# Patient Record
Sex: Male | Born: 1975
Health system: Southern US, Community
[De-identification: ages and names within clinical notes are randomized; demographics above are authoritative.]

---

## 2004-03-08 ENCOUNTER — Emergency Department (HOSPITAL_COMMUNITY): Admission: EM | Admit: 2004-03-08 | Discharge: 2004-03-08 | Payer: Self-pay | Admitting: Emergency Medicine

## 2004-03-14 ENCOUNTER — Ambulatory Visit (HOSPITAL_BASED_OUTPATIENT_CLINIC_OR_DEPARTMENT_OTHER): Admission: RE | Admit: 2004-03-14 | Discharge: 2004-03-14 | Payer: Self-pay | Admitting: Orthopedic Surgery

## 2010-09-11 ENCOUNTER — Emergency Department (HOSPITAL_COMMUNITY)
Admission: EM | Admit: 2010-09-11 | Discharge: 2010-09-11 | Disposition: A | Discharge status: Home / Self Care | Payer: 59 | Attending: Emergency Medicine | Admitting: Emergency Medicine

## 2010-09-11 DIAGNOSIS — R319 Hematuria, unspecified: Secondary | ICD-10-CM | POA: Insufficient documentation

## 2010-09-11 DIAGNOSIS — R300 Dysuria: Secondary | ICD-10-CM | POA: Insufficient documentation

## 2010-09-11 LAB — URINE MICROSCOPIC-ADD ON

## 2010-09-11 LAB — URINALYSIS, ROUTINE W REFLEX MICROSCOPIC
Bilirubin Urine: NEGATIVE
Glucose, UA: NEGATIVE mg/dL
Ketones, ur: NEGATIVE mg/dL
Leukocytes, UA: NEGATIVE
Nitrite: NEGATIVE
Protein, ur: NEGATIVE mg/dL
Specific Gravity, Urine: 1.025 (ref 1.005–1.030)
Urobilinogen, UA: 1 mg/dL (ref 0.0–1.0)
pH: 5.5 (ref 5.0–8.0)

## 2010-09-12 LAB — GC/CHLAMYDIA PROBE AMP, URINE
Chlamydia, Swab/Urine, PCR: NEGATIVE
GC Probe Amp, Urine: NEGATIVE

## 2013-07-05 ENCOUNTER — Other Ambulatory Visit: Payer: Self-pay | Admitting: Hematology and Oncology

## 2015-09-20 ENCOUNTER — Encounter (HOSPITAL_COMMUNITY): Payer: Self-pay | Admitting: *Deleted

## 2015-09-20 ENCOUNTER — Emergency Department (HOSPITAL_COMMUNITY)
Admission: EM | Admit: 2015-09-20 | Discharge: 2015-09-21 | Disposition: A | Discharge status: Home / Self Care | Payer: 59 | Attending: Emergency Medicine | Admitting: Emergency Medicine

## 2015-09-20 ENCOUNTER — Emergency Department (HOSPITAL_COMMUNITY): Payer: 59

## 2015-09-20 DIAGNOSIS — R7989 Other specified abnormal findings of blood chemistry: Secondary | ICD-10-CM | POA: Diagnosis not present

## 2015-09-20 DIAGNOSIS — I4891 Unspecified atrial fibrillation: Secondary | ICD-10-CM

## 2015-09-20 DIAGNOSIS — R000 Tachycardia, unspecified: Secondary | ICD-10-CM | POA: Diagnosis present

## 2015-09-20 LAB — BASIC METABOLIC PANEL
Anion gap: 14 (ref 5–15)
BUN: 13 mg/dL (ref 6–20)
CO2: 25 mmol/L (ref 22–32)
Calcium: 9.5 mg/dL (ref 8.9–10.3)
Chloride: 101 mmol/L (ref 101–111)
Creatinine, Ser: 1.52 mg/dL — ABNORMAL HIGH (ref 0.61–1.24)
GFR calc Af Amer: >60 mL/min (ref >60)
GFR calc non Af Amer: 57 mL/min — ABNORMAL LOW (ref >60)
Glucose, Bld: 137 mg/dL — ABNORMAL HIGH (ref 65–99)
Potassium: 4 mmol/L (ref 3.5–5.1)
Sodium: 140 mmol/L (ref 135–145)

## 2015-09-20 LAB — CBC
HCT: 47.9 % (ref 39.0–52.0)
Hemoglobin: 16.1 g/dL (ref 13.0–17.0)
MCH: 28.9 pg (ref 26.0–34.0)
MCHC: 33.6 g/dL (ref 30.0–36.0)
MCV: 86 fL (ref 78.0–100.0)
Platelets: 277 10*3/uL (ref 150–400)
RBC: 5.57 MIL/uL (ref 4.22–5.81)
RDW: 14.9 % (ref 11.5–15.5)
WBC: 12.6 10*3/uL — ABNORMAL HIGH (ref 4.0–10.5)

## 2015-09-20 LAB — MAGNESIUM: Magnesium: 2.1 mg/dL (ref 1.7–2.4)

## 2015-09-20 LAB — TROPONIN I: Troponin I: <0.03 ng/mL (ref <0.031)

## 2015-09-20 MED ORDER — ETOMIDATE 2 MG/ML IV SOLN
10.0000 mg | Freq: Once | INTRAVENOUS | Status: AC
  Administered 2015-09-21: 10 mg via INTRAVENOUS
  Filled 2015-09-20: qty 10

## 2015-09-20 MED ORDER — ENOXAPARIN SODIUM 150 MG/ML ~~LOC~~ SOLN
150.0000 mg | Freq: Once | SUBCUTANEOUS | Status: AC
  Administered 2015-09-21: 150 mg via SUBCUTANEOUS
  Filled 2015-09-20: qty 1

## 2015-09-20 MED ORDER — DILTIAZEM HCL 25 MG/5ML IV SOLN
15.0000 mg | Freq: Once | INTRAVENOUS | Status: AC
  Administered 2015-09-20: 15 mg via INTRAVENOUS
  Filled 2015-09-20: qty 5

## 2015-09-20 MED ORDER — DILTIAZEM HCL 25 MG/5ML IV SOLN: 20.0000 mg | Freq: Once | INTRAVENOUS | Status: DC

## 2015-09-20 MED ORDER — FENTANYL CITRATE (PF) 100 MCG/2ML IJ SOLN
50.0000 ug | Freq: Once | INTRAMUSCULAR | Status: AC
  Administered 2015-09-21: 50 ug via INTRAVENOUS
  Filled 2015-09-20: qty 2

## 2015-09-20 MED ORDER — SODIUM CHLORIDE 0.9 % IV BOLUS (SEPSIS)
1000.0000 mL | Freq: Once | INTRAVENOUS | Status: AC
  Administered 2015-09-20: 1000 mL via INTRAVENOUS

## 2015-09-20 NOTE — ED Notes (Signed)
Pt denies any chest pain at this time.  Wife at bedside

## 2015-09-20 NOTE — ED Provider Notes (Signed)
CSN: [id number]     Arrival date & time 09/20/15  2105 History   First MD Initiated Contact with Patient 09/20/15 2128     Chief Complaint  Patient presents with  . Tachycardia     (Consider location/radiation/quality/duration/timing/severity/associated sxs/prior Treatment) Patient is a 40 y.o. male presenting with palpitations.  Palpitations Palpitations quality:  Irregular Onset quality:  Sudden Duration:  7 hours Timing:  Constant Progression:  Unchanged Chronicity:  New Context comment:  Began after he bent down while doing yardwork, acute onset Relieved by:  Nothing Worsened by:  Nothing Ineffective treatments:  None tried Associated symptoms: shortness of breath   Associated symptoms: no chest pain, no chest pressure, no cough, no nausea, no vomiting and no weakness   Risk factors: no hx of atrial fibrillation     History reviewed. No pertinent past medical history. History reviewed. No pertinent past surgical history. No family history on file. Social History  Substance Use Topics  . Smoking status: Never Smoker   . Smokeless tobacco: None  . Alcohol Use: No    Review of Systems  Constitutional: Negative for fever and chills.  HENT: Negative for congestion and rhinorrhea.   Eyes: Negative for visual disturbance.  Respiratory: Positive for shortness of breath. Negative for cough and wheezing.   Cardiovascular: Positive for palpitations. Negative for chest pain.  Gastrointestinal: Negative for nausea, vomiting and abdominal pain.  Genitourinary: Negative for dysuria and flank pain.  Musculoskeletal: Negative for neck pain.  Allergic/Immunologic: Negative for immunocompromised state.  Neurological: Negative for syncope and weakness.      Allergies  Review of patient's allergies indicates no known allergies.  Home Medications   Prior to Admission medications   Medication Sig Start Date End Date Taking? Authorizing Provider  Pseudoephedrine-Naproxen Na  (ALEVE-D SINUS & COLD) 120-220 MG TB12 Take 1 tablet by mouth as needed (for sinus).   Yes Historical Provider, MD  rivaroxaban (XARELTO) 20 MG TABS tablet Take 1 tablet (20 mg total) by mouth daily with supper. 09/21/15   Duffy Bruce, MD   BP 126/87 mmHg  Pulse 76  Temp(Src) 97.7 F (36.5 C) (Oral)  Resp 15  Ht 6\' 3"  (1.905 m)  Wt 153.854 kg  BMI 42.40 kg/m2  SpO2 100% Physical Exam  Constitutional: He is oriented to person, place, and time. He appears well-developed and well-nourished. No distress.  HENT:  Head: Normocephalic.  Mouth/Throat: No oropharyngeal exudate.  Eyes: Pupils are equal, round, and reactive to light. Right eye exhibits no discharge. Left eye exhibits no discharge.  Neck: Normal range of motion. Neck supple.  Cardiovascular: Normal heart sounds and intact distal pulses.  An irregularly irregular rhythm present. Exam reveals no friction rub.   No murmur heard. Pulmonary/Chest: Effort normal and breath sounds normal. No respiratory distress. He has no wheezes. He has no rales.  Abdominal: Soft. He exhibits no distension. There is no tenderness.  Musculoskeletal: He exhibits no edema.  Neurological: He is alert and oriented to person, place, and time.  Skin: Skin is warm. No rash noted.  Nursing note and vitals reviewed.   ED Course  .Sedation Date/Time: 09/21/2015 12:01 PM Performed by: Duffy Bruce Authorized by: Gareth Morgan  Consent:    Consent obtained:  Written   Consent given by:  Patient   Risks discussed:  Allergic reaction, inadequate sedation, nausea, prolonged hypoxia resulting in organ damage, prolonged sedation necessitating reversal, respiratory compromise necessitating ventilatory assistance and intubation, vomiting and dysrhythmia   Alternatives discussed:  Anxiolysis  and analgesia without sedation Indications:    Sedation purpose:  Cardioversion   Procedure necessitating sedation performed by:  Physician performing sedation    Intended level of sedation:  Moderate (conscious sedation) Pre-sedation assessment:    Time since last food or drink:  >8 hours   ASA classification: class 2 - patient with mild systemic disease     Neck mobility: normal     Mouth opening:  3 or more finger widths   Thyromental distance:  3 finger widths   Mallampati score:  II - soft palate, uvula, fauces visible   Pre-sedation assessments completed and reviewed: airway patency, cardiovascular function, hydration status, mental status, nausea/vomiting, pain level, respiratory function and temperature   Immediate pre-procedure details:    Reassessment: Patient reassessed immediately prior to procedure     Reviewed: vital signs     Verified: bag valve mask available, emergency equipment available, intubation equipment available, IV patency confirmed, oxygen available and suction available   Procedure details (see MAR for exact dosages):    Preoxygenation:  Nasal cannula   Sedation:  Etomidate   Analgesia:  Fentanyl   Intra-procedure monitoring:  Blood pressure monitoring   Intra-procedure events: none   Post-procedure details:    Attendance: Constant attendance by certified staff until patient recovered     Recovery: Patient returned to pre-procedure baseline     Post-sedation assessments completed and reviewed: airway patency, cardiovascular function, hydration status, mental status, nausea/vomiting, pain level, respiratory function and temperature     Patient is stable for discharge or admission: Yes     Patient tolerance:  Tolerated well, no immediate complications .Cardioversion Date/Time: 09/21/2015 12:03 PM Performed by: Duffy Bruce Authorized by: Duffy Bruce Consent: Written consent obtained. Risks and benefits: risks, benefits and alternatives were discussed Consent given by: patient Patient understanding: patient states understanding of the procedure being performed Patient consent: the patient's understanding of the  procedure matches consent given Procedure consent: procedure consent matches procedure scheduled Relevant documents: relevant documents present and verified Test results: test results available and properly labeled Site marked: the operative site was marked Imaging studies: imaging studies available Required items: required blood products, implants, devices, and special equipment available Patient identity confirmed: arm band Time out: Immediately prior to procedure a "time out" was called to verify the correct patient, procedure, equipment, support staff and site/side marked as required. Patient sedated: yes Sedation type: moderate (conscious) sedation Vitals: Vital signs were monitored during sedation. Cardioversion basis: emergent Pre-procedure rhythm: atrial fibrillation Patient position: patient was placed in a supine position Chest area: chest area exposed Electrodes: pads Electrodes placed: anterior-posterior Number of attempts: 1 Attempt 1 mode: synchronous Attempt 1 waveform: biphasic Attempt 1 shock (in Joules): 150 Attempt 1 outcome: conversion to normal sinus rhythm Post-procedure rhythm: normal sinus rhythm Complications: no complications Patient tolerance: Patient tolerated the procedure well with no immediate complications   (including critical care time) Labs Review Labs Reviewed  BASIC METABOLIC PANEL - Abnormal; Notable for the following:    Glucose, Bld 137 (*)    Creatinine, Ser 1.52 (*)    GFR calc non Af Amer 57 (*)    All other components within normal limits  CBC - Abnormal; Notable for the following:    WBC 12.6 (*)    All other components within normal limits  TROPONIN I  MAGNESIUM  TSH    Imaging Review Dg Chest Portable 1 View  09/20/2015  CLINICAL DATA:  Tachycardia and shortness of breath. EXAM: PORTABLE CHEST 1  VIEW COMPARISON:  None. FINDINGS: The heart size and mediastinal contours are within normal limits. Both lungs are clear. The  visualized skeletal structures are unremarkable. IMPRESSION: No active disease. Electronically Signed   By: Lucienne Capers M.D.   On: 09/20/2015 22:00   I have personally reviewed and evaluated these images and lab results as part of my medical decision-making.   EKG Interpretation   Date/Time:  Wednesday September 20 2015 21:23:59 EDT Ventricular Rate:  177 PR Interval:    QRS Duration: 84 QT Interval:  266 QTC Calculation: 456 R Axis:   72 Text Interpretation:  Atrial fibrillation with rapid ventricular response  Marked ST abnormality, possible inferior subendocardial injury Abnormal  ECG No significant change since last tracing Confirmed by [facility] MD,  ERIN ([id number]) on 09/21/2015 1:17:37 AM Also confirmed by [facility] MD, ERIN  ([id number]), editor Gilford Rile, CCT, [place] ([id number])  on 09/21/2015 7:20:24 AM      MDM   40 yo M with no significant PMHx who p/w a 6-hour history of irregular palpitations. EKG on arrival shows new onset AFib with RVR. Pt o/w HDS and well-appearing on exam. Will check labs, give IVF, attempt rate control at this time. Pt is adamant that he noticed acute onset of palpitations <12 hr ago, however, and is therefore cardioversion candidate if rate control is unsuccessful.   Labs/imaging reviewed as above, and show no acute abnormality underlying new onset AFib. No infectious symptoms. Electrolytes WNL. CXR shows no PNA, PTX, or other abnormality. Trop negative, which is reassuring and EKG is non-ischemic. TSH wnl.  Pt had mild rate control with dilt doubt but returned to AFib with RVR. Given sx <12 hr with known, acute onset, procedural sedation and cardioversion performed as above. Pt tolerated well with conversion to NSR with no acute ischemia. Pt remains in NSR. Given absence of identifiable/reversible etiology with now conversion to NSR, per AFib pathway will d/c with Xarelto, outpt f/u in AFib clinic in 3 days. Return precautions given. Pt remains neurologically  intact post-conversion with no CN deficits, 5/5 strength in UE and LE bilaterally, normal sensation, normal cerebellar testing.  Clinical Impression: 1. New onset atrial fibrillation (HCC)   2. Elevated serum creatinine     Disposition: Discharge  Condition: Good  I have discussed the results, Dx and Tx plan with the pt(& family if present). He/she/they expressed understanding and agree(s) with the plan. Discharge instructions discussed at great length. Strict return precautions discussed and pt &/or family have verbalized understanding of the instructions. No further questions at time of discharge.    Discharge Medication List as of 09/21/2015  1:34 AM    START taking these medications   Details  rivaroxaban (XARELTO) 20 MG TABS tablet Take 1 tablet (20 mg total) by mouth daily with supper., Starting 09/21/2015, Until Discontinued, Print        Follow Up: Atrial Fibrillation Clinic Call [phone] to set up an appointment in [facility] in the [facility] at [facility] In 3 days Follow-up of atrial fibrillation  [place] [address] [place] [phone]  Call to set up appointment at AFib clinic if unable to reach number above   Pt seen in conjunction with Dr. Erin Sons, MD 09/21/15 [id number]  Gareth Morgan, MD 09/25/15 [id number]  Gareth Morgan, MD 09/25/15 [id number]  Gareth Morgan, MD 10/05/15 [phone]

## 2015-09-20 NOTE — ED Notes (Signed)
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  He has been burping and feeling like he has indigfestion  He has had antiacids  Hx of reflux

## 2015-09-21 LAB — TSH: TSH: 2.653 u[IU]/mL (ref 0.350–4.500)

## 2015-09-21 MED ORDER — ETOMIDATE 2 MG/ML IV SOLN
INTRAVENOUS | Status: DC | PRN
  Administered 2015-09-21: 10 mg via INTRAVENOUS

## 2015-09-21 MED ORDER — RIVAROXABAN 20 MG PO TABS: 20.0000 mg | ORAL_TABLET | Freq: Every day | ORAL | Status: DC

## 2015-09-21 NOTE — Discharge Instructions (Signed)

## 2015-09-25 ENCOUNTER — Encounter (HOSPITAL_COMMUNITY): Payer: Self-pay | Admitting: Nurse Practitioner

## 2015-09-25 ENCOUNTER — Ambulatory Visit (HOSPITAL_COMMUNITY)
Admission: RE | Admit: 2015-09-25 | Discharge: 2015-09-25 | Disposition: A | Discharge status: Home / Self Care | Payer: 59 | Source: Ambulatory Visit | Attending: Nurse Practitioner | Admitting: Nurse Practitioner

## 2015-09-25 VITALS — BP 178/110 | HR 132 | Ht 75.0 in | Wt 339.6 lb

## 2015-09-25 DIAGNOSIS — R000 Tachycardia, unspecified: Secondary | ICD-10-CM | POA: Insufficient documentation

## 2015-09-25 DIAGNOSIS — Z7901 Long term (current) use of anticoagulants: Secondary | ICD-10-CM | POA: Diagnosis not present

## 2015-09-25 DIAGNOSIS — R0683 Snoring: Secondary | ICD-10-CM | POA: Insufficient documentation

## 2015-09-25 DIAGNOSIS — I4891 Unspecified atrial fibrillation: Secondary | ICD-10-CM | POA: Diagnosis present

## 2015-09-25 DIAGNOSIS — I480 Paroxysmal atrial fibrillation: Secondary | ICD-10-CM

## 2015-09-25 MED ORDER — METOPROLOL SUCCINATE ER 25 MG PO TB24: 25.0000 mg | ORAL_TABLET | Freq: Every day | ORAL | Status: DC

## 2015-09-25 NOTE — Patient Instructions (Signed)
Your physician has recommended you make the following change in your medication:  1)Metoprolol 25mg  once a day at bedtime  Your physician has requested that you have an echocardiogram. Echocardiography is a painless test that uses sound waves to create images of your heart. It provides your doctor with information about the size and shape of your heart and how well your heart's chambers and valves are working. This procedure takes approximately one hour. There are no restrictions for this procedure.

## 2015-09-25 NOTE — Progress Notes (Signed)
Patient ID: Donald Reyes, male   DOB: [birth year], 40 y.o.   MRN: [id number]     Primary Care Physician: No primary care provider on file. Referring Physician:[facility] ER f/u   Donald Reyes is a 40 y.o. male with a h/o PAF newly diagnosed on ER visit for elevated heart rate 4/19. He states that he was working out in the yard earlier in the day and felt his heart rate speed up and felt fullness in his chest. 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 When it did not slow down, he went to the ER and was found to be in Afib with RVR. Since he had been in afib for just a few hours, it was decided to cardiovert pt which was successful. He has stayed in [name]. He does admit to anxiety and white coat syndrome when he has a doctors appointment and for this reason has avoided doctor's for some time. He has always been told his BP was elevated in the doctors office.  He admits a couple years ago that he got on a walking program and lost a lot of weight but changed jobs and his routine changed and has now regained the weight and gotten out of his exercise routine. He does not drink alcohol or smoke. High caffeine intake.He does sound like he snores and may have apnea spells but he states all that improved with his previous weight loss. He appears motivated to make a change. Ekg shows a Sinus tach at 132 bpm. He again admits to a elevated heart rate in past especially when he is anxious.Father may have had early onset afib. Does drinks a lot of coffee. He is on xarleto for a chadsvasc score of 0-1( 1 if you count elevated BP today but has not been diagnosed)  Today, he denies symptoms of palpitations, chest pain, shortness of breath, orthopnea, PND, lower extremity edema, dizziness, presyncope, syncope, or neurologic sequela. The patient is tolerating medications without difficulties and is otherwise without complaint today.   No past medical history on file. No past surgical history on  file.  Current Outpatient Prescriptions  Medication Sig Dispense Refill  . rivaroxaban (XARELTO) 20 MG TABS tablet Take 1 tablet (20 mg total) by mouth daily with supper. 28 tablet 0  . metoprolol succinate (TOPROL XL) 25 MG 24 hr tablet Take 1 tablet (25 mg total) by mouth daily. 30 tablet 6   No current facility-administered medications for this encounter.    No Known Allergies  Social History   Social History  . Marital Status: Married    Spouse Name: N/A  . Number of Children: N/A  . Years of Education: N/A   Occupational History  . Not on file.   Social History Main Topics  . Smoking status: Never Smoker   . Smokeless tobacco: Not on file  . Alcohol Use: No  . Drug Use: Not on file  . Sexual Activity: Not on file   Other Topics Concern  . Not on file   Social History Narrative    No family history on file.  ROS- All systems are reviewed and negative except as per the HPI above  Physical Exam: Filed Vitals:   09/25/15 1117  BP: 178/110  Pulse: 132  Height: 6\' 3"  (1.905 m)  Weight: 339 lb 9.6 oz (154.042 kg)    GEN- The patient is well appearing, alert and oriented x 3 today.   Head- normocephalic, atraumatic Eyes-  Sclera clear,  conjunctiva pink Ears- hearing intact Oropharynx- clear Neck- supple, no JVP Lymph- no cervical lymphadenopathy Lungs- Clear to ausculation bilaterally, normal work of breathing Heart- Regular, rapid rate and rhythm, no murmurs, rubs or gallops, PMI not laterally displaced GI- soft, NT, ND, + BS Extremities- no clubbing, cyanosis, or edema MS- no significant deformity or atrophy Skin- no rash or lesion Psych- euthymic mood, full affect Neuro- strength and sensation are intact  EKG-Sinus tach at 132 bpm with pr int 144 ms, qrs int 82 ms, qtc 432 ms Epic records reviewed  Assessment and Plan: 1. New onset afib Successful cardioversion  Continue xarelto x at least 30 days Echo  2. Elevated heart rate/BP Start  metoprolol ER at 25 mg a day Try to check a HR/BP out of clinic at a drugstore over the next week   3.Lifestyle measures Increase exercise routine Diet modification for initial 5-10% weight loss Diet class recommended Reduce caffeine intake If weight loss does not correct snoring then suggest sleep study Is Planning to establish with PCP for physical  F/u in 7-10 days in afib clinic  [place] C. Carroll, [facility] [address] [place], [place] [address] [phone]

## 2015-10-03 ENCOUNTER — Ambulatory Visit (HOSPITAL_COMMUNITY)
Admission: RE | Admit: 2015-10-03 | Discharge: 2015-10-03 | Disposition: A | Discharge status: Home / Self Care | Payer: 59 | Source: Ambulatory Visit | Attending: Nurse Practitioner | Admitting: Nurse Practitioner

## 2015-10-03 DIAGNOSIS — I4891 Unspecified atrial fibrillation: Secondary | ICD-10-CM | POA: Diagnosis present

## 2015-10-03 DIAGNOSIS — I480 Paroxysmal atrial fibrillation: Secondary | ICD-10-CM | POA: Diagnosis not present

## 2015-10-03 DIAGNOSIS — I119 Hypertensive heart disease without heart failure: Secondary | ICD-10-CM | POA: Insufficient documentation

## 2015-10-03 NOTE — Progress Notes (Signed)
  Echocardiogram 2D Echocardiogram has been performed.  Donald Reyes 10/03/2015, 11:30 AM

## 2015-10-10 ENCOUNTER — Ambulatory Visit (HOSPITAL_COMMUNITY)
Admission: RE | Admit: 2015-10-10 | Discharge: 2015-10-10 | Disposition: A | Discharge status: Home / Self Care | Payer: 59 | Source: Ambulatory Visit | Attending: Nurse Practitioner | Admitting: Nurse Practitioner

## 2015-10-10 ENCOUNTER — Encounter (HOSPITAL_COMMUNITY): Payer: Self-pay | Admitting: Nurse Practitioner

## 2015-10-10 VITALS — BP 160/82 | HR 97 | Ht 75.0 in | Wt 337.2 lb

## 2015-10-10 DIAGNOSIS — Z7901 Long term (current) use of anticoagulants: Secondary | ICD-10-CM | POA: Insufficient documentation

## 2015-10-10 DIAGNOSIS — I10 Essential (primary) hypertension: Secondary | ICD-10-CM | POA: Diagnosis not present

## 2015-10-10 DIAGNOSIS — I480 Paroxysmal atrial fibrillation: Secondary | ICD-10-CM | POA: Diagnosis not present

## 2015-10-10 DIAGNOSIS — Z0189 Encounter for other specified special examinations: Secondary | ICD-10-CM | POA: Diagnosis present

## 2015-10-10 MED ORDER — METOPROLOL SUCCINATE ER 25 MG PO TB24: 25.0000 mg | ORAL_TABLET | Freq: Two times a day (BID) | ORAL | Status: DC

## 2015-10-10 NOTE — Progress Notes (Signed)
Patient ID: Donald Reyes, male   DOB: 10/21/1975, 40 y.o.   MRN: [id number]     Primary Care Physician: No primary care provider on file. Referring Physician: [facility] ER f/u   Dyvon Durrett is a 40 y.o. male with a h/o PAF newly diagnosed on ER visit for elevated heart rate 4/19. He states that he was working out in the yard earlier in the day and felt his heart rate speed up and felt fullness in his chest. 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 When it did not slow down, he went to the ER and was found to be in Afib with RVR. Since he had been in afib for just a few hours, it was decided to cardiovert pt which was successful. He has stayed in [place]. He does admit to anxiety and white coat syndrome when he has a doctors appointment and for this reason has avoided doctor's for some time. He has always been told his BP was elevated in the doctors office.  He admits a couple years ago that he got on a walking program and lost a lot of weight but changed jobs and his routine changed and has now regained the weight and gotten out of his exercise routine. He does not drink alcohol or smoke. High caffeine intake.He does sound like he snores and may have apnea spells but he states all that improved with his previous weight loss. He appears motivated to make a change. Ekg shows a Sinus tach at 132 bpm. He again admits to a elevated heart rate in past especially when he is anxious.Father may have had early onset afib. Does drinks a lot of coffee. He is on xarleto for a chadsvasc score of 0-1( 1 if you count elevated BP today but has not been diagnosed)  He returns to clinic today and is SR. He feels well. He has lost 2 lbs. He is walking 4 days a week. He does snore but improved before with prior weight loss.He wants to wait until he has lost weight to see if snoring improved like previous but if still with snoring issues in 6 months, he should get sleep study. Echo showed Mod LVH and BP elevated  again today.  Today, he denies symptoms of palpitations, chest pain, shortness of breath, orthopnea, PND, lower extremity edema, dizziness, presyncope, syncope, or neurologic sequela. The patient is tolerating medications without difficulties and is otherwise without complaint today.   No past medical history on file. No past surgical history on file.  Current Outpatient Prescriptions  Medication Sig Dispense Refill  . metoprolol succinate (TOPROL XL) 25 MG 24 hr tablet Take 1 tablet (25 mg total) by mouth 2 (two) times daily. 60 tablet 6  . rivaroxaban (XARELTO) 20 MG TABS tablet Take 1 tablet (20 mg total) by mouth daily with supper. 28 tablet 0   No current facility-administered medications for this encounter.    No Known Allergies  Social History   Social History  . Marital Status: Married    Spouse Name: N/A  . Number of Children: N/A  . Years of Education: N/A   Occupational History  . Not on file.   Social History Main Topics  . Smoking status: Never Smoker   . Smokeless tobacco: Not on file  . Alcohol Use: No  . Drug Use: Not on file  . Sexual Activity: Not on file   Other Topics Concern  . Not on file   Social History  Narrative    No family history on file.  ROS- All systems are reviewed and negative except as per the HPI above  Physical Exam: Filed Vitals:   10/10/15 0906  BP: 160/82  Pulse: 97  Height: 6\' 3"  (1.905 m)  Weight: 337 lb 3.2 oz (152.953 kg)    GEN- The patient is well appearing, alert and oriented x 3 today.   Head- normocephalic, atraumatic Eyes-  Sclera clear, conjunctiva pink Ears- hearing intact Oropharynx- clear Neck- supple, no JVP Lymph- no cervical lymphadenopathy Lungs- Clear to ausculation bilaterally, normal work of breathing Heart- Regular, rapid rate and rhythm, no murmurs, rubs or gallops, PMI not laterally displaced GI- soft, NT, ND, + BS Extremities- no clubbing, cyanosis, or edema MS- no significant deformity or  atrophy Skin- no rash or lesion Psych- euthymic mood, full affect Neuro- strength and sensation are intact  EKG-Sinus tach at 132 bpm with pr int 144 ms, qrs int 82 ms, qtc 432 ms Epic records reviewed Echo- - Left ventricle: The cavity size was normal. Wall thickness was  increased in a pattern of moderate LVH. Systolic function was  normal. The estimated ejection fraction was in the range of 60%  to 65%. Wall motion was normal; there were no regional wall  motion abnormalities. Doppler parameters are consistent with  abnormal left ventricular relaxation (grade 1 diastolic  dysfunction). The E/e&' ratio is between 8-15, suggesting  indeterminate LV filling pressure. - Left atrium: The atrium was normal in size. - Right atrium: The atrium was normal in size. - Inferior vena cava: The vessel was normal in size. The  respirophasic diameter changes were in the normal range (>= 50%),  consistent with normal central venous pressure.  Impressions:  - LVEF 60-65%, moderate LVH, normal wall motion, diastolic  dysfunction, indeterminate LV filling pressure, normal biatrial  size, normal IVC.  Assessment and Plan: 1. New onset afib Successful cardioversion  Continue xarelto x at least 30 days for chads vasc of 1(HTN, although not dx'ed but with elevated BP on 2 occssions here Continue metoprolol  2. Elevated BP Increase  metoprolol ER  to 50 mg mg a day Recheck BP/HR in 10 days in clinic  3.Lifestyle measures Increase exercise routine Diet modification for initial 5-10% weight loss Diet class recommended Reduce caffeine intake If weight loss does not correct snoring then suggest sleep study Is planning to establish with PCP for physical  F/u in 10 days in afib clinic  Kesha Hurrell C. Wylder Macomber, [facility] [address] [place], [place] [address] [phone]

## 2015-10-10 NOTE — Patient Instructions (Signed)
Your physician has recommended you make the following change in your medication:  1)Increase metoprolol 25mg twice a day  

## 2015-10-24 ENCOUNTER — Ambulatory Visit (HOSPITAL_COMMUNITY)
Admission: RE | Admit: 2015-10-24 | Discharge: 2015-10-24 | Disposition: A | Discharge status: Home / Self Care | Payer: 59 | Source: Ambulatory Visit | Attending: Nurse Practitioner | Admitting: Nurse Practitioner

## 2015-10-24 DIAGNOSIS — I480 Paroxysmal atrial fibrillation: Secondary | ICD-10-CM | POA: Diagnosis present

## 2015-10-24 NOTE — Progress Notes (Signed)
Pt in for BP/HR check after increasing metoprolol to 25mg  BID. Patient is asymptomatic with increase.  BP 180/98 HR 99. Roderic Palau NP to review

## 2016-05-13 ENCOUNTER — Other Ambulatory Visit (HOSPITAL_COMMUNITY): Payer: Self-pay | Admitting: *Deleted

## 2016-05-13 MED ORDER — METOPROLOL SUCCINATE ER 25 MG PO TB24: 25.0000 mg | ORAL_TABLET | Freq: Two times a day (BID) | ORAL | 3 refills | Status: DC

## 2016-06-17 ENCOUNTER — Other Ambulatory Visit (HOSPITAL_COMMUNITY): Payer: Self-pay | Admitting: *Deleted

## 2016-06-17 MED ORDER — METOPROLOL SUCCINATE ER 25 MG PO TB24: 25.0000 mg | ORAL_TABLET | Freq: Two times a day (BID) | ORAL | 3 refills | Status: DC

## 2016-08-26 DIAGNOSIS — I10 Essential (primary) hypertension: Secondary | ICD-10-CM | POA: Diagnosis not present

## 2016-08-26 DIAGNOSIS — I480 Paroxysmal atrial fibrillation: Secondary | ICD-10-CM | POA: Diagnosis not present

## 2016-08-26 DIAGNOSIS — R7309 Other abnormal glucose: Secondary | ICD-10-CM | POA: Diagnosis not present

## 2016-09-26 DIAGNOSIS — R7303 Prediabetes: Secondary | ICD-10-CM | POA: Diagnosis not present

## 2016-09-26 DIAGNOSIS — I480 Paroxysmal atrial fibrillation: Secondary | ICD-10-CM | POA: Diagnosis not present

## 2016-09-26 DIAGNOSIS — I10 Essential (primary) hypertension: Secondary | ICD-10-CM | POA: Diagnosis not present

## 2016-10-25 DIAGNOSIS — I480 Paroxysmal atrial fibrillation: Secondary | ICD-10-CM | POA: Diagnosis not present

## 2016-10-25 DIAGNOSIS — I10 Essential (primary) hypertension: Secondary | ICD-10-CM | POA: Diagnosis not present

## 2016-10-25 DIAGNOSIS — R7303 Prediabetes: Secondary | ICD-10-CM | POA: Diagnosis not present

## 2017-02-10 DIAGNOSIS — I480 Paroxysmal atrial fibrillation: Secondary | ICD-10-CM | POA: Diagnosis not present

## 2017-02-10 DIAGNOSIS — R7303 Prediabetes: Secondary | ICD-10-CM | POA: Diagnosis not present

## 2017-02-10 DIAGNOSIS — E7800 Pure hypercholesterolemia, unspecified: Secondary | ICD-10-CM | POA: Diagnosis not present

## 2017-02-10 DIAGNOSIS — I10 Essential (primary) hypertension: Secondary | ICD-10-CM | POA: Diagnosis not present

## 2017-05-01 DIAGNOSIS — H1010 Acute atopic conjunctivitis, unspecified eye: Secondary | ICD-10-CM | POA: Diagnosis not present

## 2017-05-01 DIAGNOSIS — J302 Other seasonal allergic rhinitis: Secondary | ICD-10-CM | POA: Diagnosis not present

## 2017-05-01 DIAGNOSIS — J3089 Other allergic rhinitis: Secondary | ICD-10-CM | POA: Diagnosis not present

## 2017-06-12 IMAGING — CR DG CHEST 1V PORT
2 series · 2 of 2 positions shown · non-contrast
Comparison: None.

CLINICAL DATA: Tachycardia and shortness of breath.

EXAM:
PORTABLE CHEST 1 VIEW

[AP (1 of 2)]
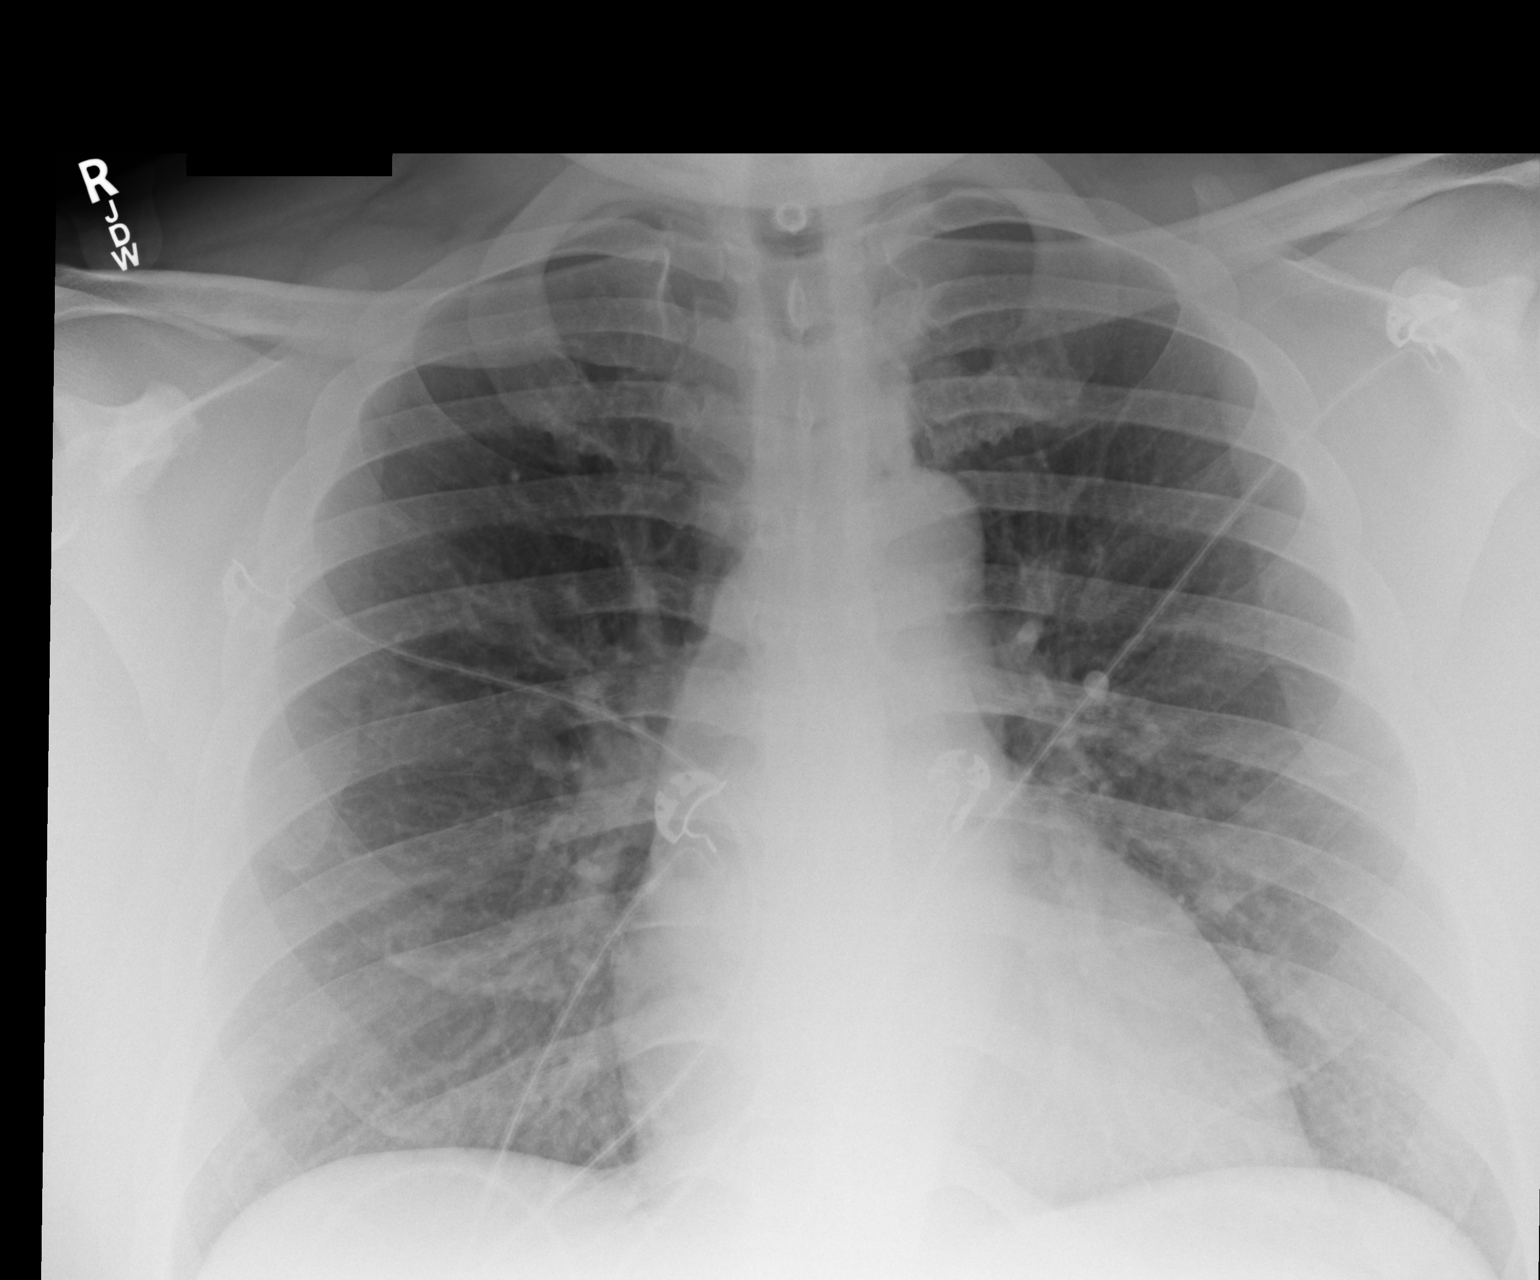

[AP (2 of 2)]
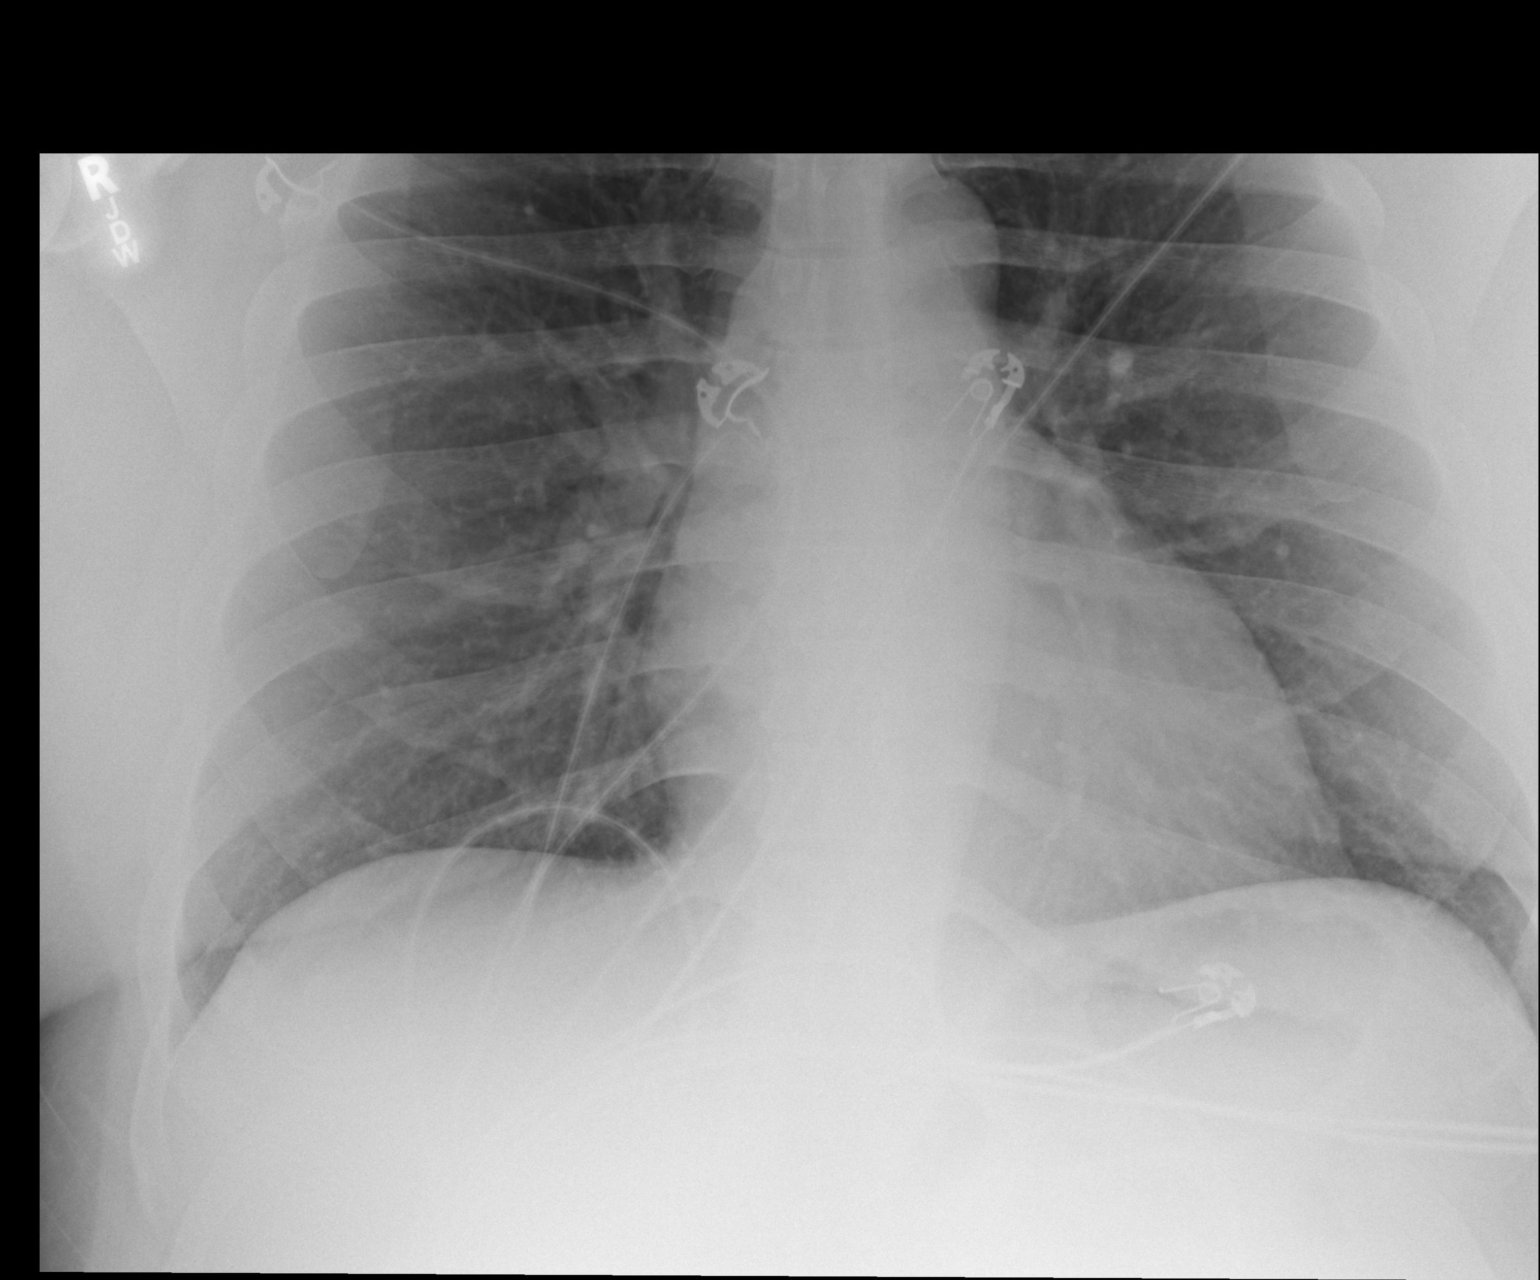

[2 of 2 positions shown; findings below may reference images not displayed]

FINDINGS: The heart size and mediastinal contours are within normal limits.
Both lungs are clear. The visualized skeletal structures are
unremarkable.
IMPRESSION: No active disease.

## 2017-07-06 ENCOUNTER — Emergency Department (HOSPITAL_COMMUNITY): Payer: 59

## 2017-07-06 ENCOUNTER — Other Ambulatory Visit: Payer: Self-pay

## 2017-07-06 ENCOUNTER — Encounter (HOSPITAL_COMMUNITY): Payer: Self-pay | Admitting: Emergency Medicine

## 2017-07-06 DIAGNOSIS — Z79899 Other long term (current) drug therapy: Secondary | ICD-10-CM | POA: Diagnosis not present

## 2017-07-06 DIAGNOSIS — R0602 Shortness of breath: Secondary | ICD-10-CM | POA: Diagnosis not present

## 2017-07-06 DIAGNOSIS — I4891 Unspecified atrial fibrillation: Secondary | ICD-10-CM | POA: Insufficient documentation

## 2017-07-06 LAB — BASIC METABOLIC PANEL
ANION GAP: 12 (ref 5–15)
BUN: 15 mg/dL (ref 6–20)
CALCIUM: 9.1 mg/dL (ref 8.9–10.3)
CHLORIDE: 101 mmol/L (ref 101–111)
CO2: 23 mmol/L (ref 22–32)
Creatinine, Ser: 1.48 mg/dL — ABNORMAL HIGH (ref 0.61–1.24)
GFR calc Af Amer: >60 mL/min (ref >60)
GFR calc non Af Amer: 58 mL/min — ABNORMAL LOW (ref >60)
GLUCOSE: 164 mg/dL — AB (ref 65–99)
Potassium: 4.4 mmol/L (ref 3.5–5.1)
Sodium: 136 mmol/L (ref 135–145)

## 2017-07-06 LAB — I-STAT TROPONIN, ED: Troponin i, poc: 0 ng/mL (ref 0.00–0.08)

## 2017-07-06 LAB — CBC
HEMATOCRIT: 45.6 % (ref 39.0–52.0)
HEMOGLOBIN: 15.7 g/dL (ref 13.0–17.0)
MCH: 30.1 pg (ref 26.0–34.0)
MCHC: 34.4 g/dL (ref 30.0–36.0)
MCV: 87.5 fL (ref 78.0–100.0)
Platelets: 182 10*3/uL (ref 150–400)
RBC: 5.21 MIL/uL (ref 4.22–5.81)
RDW: 14.6 % (ref 11.5–15.5)
WBC: 13.9 10*3/uL — ABNORMAL HIGH (ref 4.0–10.5)

## 2017-07-06 NOTE — ED Triage Notes (Signed)
Reports sudden onset of elevated heart rate and SOB.  Hx of afib.  No longer on blood thinners per patient.

## 2017-07-07 ENCOUNTER — Emergency Department (HOSPITAL_COMMUNITY)
Admission: EM | Admit: 2017-07-07 | Discharge: 2017-07-07 | Disposition: A | Discharge status: Home / Self Care | Payer: 59 | Attending: Emergency Medicine | Admitting: Emergency Medicine

## 2017-07-07 ENCOUNTER — Telehealth (HOSPITAL_COMMUNITY): Payer: Self-pay | Admitting: *Deleted

## 2017-07-07 DIAGNOSIS — I4891 Unspecified atrial fibrillation: Secondary | ICD-10-CM

## 2017-07-07 LAB — MAGNESIUM: MAGNESIUM: 2.3 mg/dL (ref 1.7–2.4)

## 2017-07-07 MED ORDER — ETOMIDATE 2 MG/ML IV SOLN
INTRAVENOUS | Status: AC
  Filled 2017-07-07: qty 10

## 2017-07-07 MED ORDER — RIVAROXABAN 20 MG PO TABS
20.0000 mg | ORAL_TABLET | Freq: Once | ORAL | Status: AC
  Administered 2017-07-07: 20 mg via ORAL
  Filled 2017-07-07: qty 1

## 2017-07-07 MED ORDER — RIVAROXABAN 20 MG PO TABS: 20.0000 mg | ORAL_TABLET | Freq: Every day | ORAL | 0 refills | Status: AC

## 2017-07-07 MED ORDER — ETOMIDATE 2 MG/ML IV SOLN
8.0000 mg | Freq: Once | INTRAVENOUS | Status: AC
  Administered 2017-07-07: 8 mg via INTRAVENOUS

## 2017-07-07 MED ORDER — RIVAROXABAN (XARELTO) EDUCATION KIT FOR AFIB PATIENTS
PACK | Freq: Once | Status: AC
  Administered 2017-07-07: 02:00:00
  Filled 2017-07-07: qty 1

## 2017-07-07 NOTE — ED Notes (Addendum)
Pt placed on O2 at 2L. ETCO2 in place running at 42. NS infusing wide open. Defib pads placed on pt., along with 12 lead monitor. O2 sat remains at 99-100 %. Pt alert and orient x 4 at this time. All questions answered. Respiratory at bedside. MD and med student at bedside. Matt,RN at bedside. Consent signed and witnessed. Family at bedside and will step into hallway during procedure.

## 2017-07-07 NOTE — Telephone Encounter (Signed)
Pt cardioverted in the ED.  Pt started on Xarelto.  I cld pt to make follow up appt and he stated he stayed out of work due to getting home so late, so he will call back to sched when he gets his work sched.  Pt was advised also to pick up his xarelto RX and that it would be important for him not to miss this medication.  Pt stated he would pick it up today and call back to sched.

## 2017-07-07 NOTE — ED Provider Notes (Addendum)
[facility] EMERGENCY DEPARTMENT Provider Note   CSN: [id number] Arrival date & time: 07/06/17  2145     History   Chief Complaint Chief Complaint  Patient presents with  . Atrial Fibrillation    HPI Donald Reyes is a 42 y.o. male.  HPI 42 year old male with history of paroxysmal A. Fib comes in with sudden onset of palpitations while he was watching Super Bowl.  Patient symptoms started around 9 PM.  Patient is having associated shortness of breath without any chest discomfort.  Patient also denies any dizziness or near syncope.  Patient had a similar episode last year, and he was sent home after cardioversion.  Patient denies any history of substance abuse, CAD, CHF, PE.  Patient has been taking his medications as prescribed.   History reviewed. No pertinent past medical history.  There are no active problems to display for this patient.   History reviewed. No pertinent surgical history.     Home Medications    Prior to Admission medications   Medication Sig Start Date End Date Taking? Authorizing Provider  amLODipine (NORVASC) 10 MG tablet Take 10 mg by mouth daily.   Yes [provider]  calcium carbonate (TUMS - DOSED IN MG ELEMENTAL CALCIUM) 500 MG chewable tablet Chew 1-2 tablets by mouth 3 (three) times daily as needed for indigestion or heartburn.   Yes [provider]  metoprolol succinate (TOPROL XL) 25 MG 24 hr tablet Take 1 tablet (25 mg total) by mouth 2 (two) times daily. 06/17/16  Yes Sherran Needs, NP    Family History No family history on file.  Social History Social History   Tobacco Use  . Smoking status: Never Smoker  . Smokeless tobacco: Never Used  Substance Use Topics  . Alcohol use: No  . Drug use: Not on file     Allergies   Patient has no known allergies.   Review of Systems Review of Systems  Constitutional: Positive for activity change.  Respiratory: Positive for shortness of breath.    Cardiovascular: Positive for palpitations.  Hematological: Does not bruise/bleed easily.  All other systems reviewed and are negative.    Physical Exam Updated Vital Signs BP 136/88   Pulse 75   Temp 98.1 F (36.7 C) (Oral)   Resp 16   Ht 6' 3.5" (1.918 m)   Wt 131.5 kg (290 lb)   SpO2 99%   BMI 35.77 kg/m   Physical Exam  Constitutional: He is oriented to person, place, and time. He appears well-developed.  HENT:  Head: Atraumatic.  Neck: Neck supple.  Cardiovascular:  Tachycardia, irregularly irregular  Pulmonary/Chest: Effort normal.  Abdominal: Soft.  Neurological: He is alert and oriented to person, place, and time.  Skin: Skin is warm.  Nursing note and vitals reviewed.    ED Treatments / Results  Labs (all labs ordered are listed, but only abnormal results are displayed) Labs Reviewed  BASIC METABOLIC PANEL - Abnormal; Notable for the following components:      Result Value   Glucose, Bld 164 (*)    Creatinine, Ser 1.48 (*)    GFR calc non Af Amer 58 (*)    All other components within normal limits  CBC - Abnormal; Notable for the following components:   WBC 13.9 (*)    All other components within normal limits  MAGNESIUM  I-STAT TROPONIN, ED    EKG    EKG Interpretation  Date/Time:  Sunday July 06 2017 21:50:00 EST  Ventricular Rate:  164 PR Interval:    QRS Duration: 80 QT Interval:  278 QTC Calculation: 459 R Axis:   99 Text Interpretation:  Atrial fibrillation with rapid ventricular response Rightward axis Marked ST abnormality, possible inferior subendocardial injury Abnormal ECG afib is new Confirmed by Varney Biles [phone]) on 07/07/2017 1:14:24 AM        EKG Interpretation  Date/Time:  Monday July 07 2017 01:25:54 EST Ventricular Rate:  75 PR Interval:    QRS Duration: 76 QT Interval:  351 QTC Calculation: 392 R Axis:   73 Text Interpretation:  Sinus rhythm Prolonged PR interval Borderline T abnormalities, inferior  leads [id number] No acute changes post cardioversion Confirmed by Varney Biles ([id number]) on 07/07/2017 1:30:54 AM         Radiology Dg Chest 2 View  Result Date: 07/06/2017 CLINICAL DATA:  Tachycardia.  Shortness of breath. EXAM: CHEST  2 VIEW COMPARISON:  September 20, 2015 FINDINGS: The heart size and mediastinal contours are within normal limits. Both lungs are clear. The visualized skeletal structures are unremarkable. IMPRESSION: No active cardiopulmonary disease. Electronically Signed   By: Dorise Bullion III M.D   On: 07/06/2017 22:56    Procedures .Cardioversion Date/Time: 07/07/2017 1:20 AM Performed by: Varney Biles, MD Authorized by: Varney Biles, MD   Consent:    Consent obtained:  Written   Risks discussed:  Cutaneous burn, death, induced arrhythmia and pain   Alternatives discussed:  No treatment Pre-procedure details:    Cardioversion basis:  Elective   Rhythm:  Atrial fibrillation   Electrode placement:  Anterior-lateral Patient sedated: Yes. Refer to sedation procedure documentation for details of sedation.  Attempt one:    Cardioversion mode:  Synchronous   Waveform:  Biphasic   Shock (Joules):  150   Shock outcome:  Conversion to normal sinus rhythm Post-procedure details:    Patient status:  Alert   Patient tolerance of procedure:  Tolerated well, no immediate complications  .Sedation Date/Time: 07/07/2017 1:16 AM Performed by: Varney Biles, MD Authorized by: Varney Biles, MD   Consent:    Consent obtained:  Written   Consent given by:  Patient   Risks discussed:  Allergic reaction, dysrhythmia, inadequate sedation, vomiting, prolonged hypoxia resulting in organ damage, respiratory compromise necessitating ventilatory assistance and intubation and nausea   Alternatives discussed:  Analgesia without sedation Universal protocol:    Procedure explained and questions answered to patient or proxy's satisfaction: yes     Relevant documents present and  verified: yes     Test results available and properly labeled: yes     Imaging studies available: yes     Required blood products, implants, devices, and special equipment available: yes     Immediately prior to procedure a time out was called: yes   Indications:    Procedure performed:  Cardioversion   Procedure necessitating sedation performed by:  Physician performing sedation   Intended level of sedation:  Moderate (conscious sedation) Pre-sedation assessment:    Time since last food or drink:  4.5 hours   ASA classification: class 2 - patient with mild systemic disease     Mouth opening:  3 or more finger widths   Mallampati score:  III - soft palate, base of uvula visible   Pre-sedation assessments completed and reviewed: airway patency, cardiovascular function, mental status, nausea/vomiting, respiratory function and temperature     Pre-sedation assessment completed:  07/07/2017 1:17 AM Immediate pre-procedure details:    Reassessment: Patient reassessed immediately prior to  procedure     Reviewed: vital signs, relevant labs/tests and NPO status     Verified: bag valve mask available, emergency equipment available, intubation equipment available, IV patency confirmed, oxygen available and suction available   Procedure details (see MAR for exact dosages):    Preoxygenation:  Nasal cannula   Sedation:  Etomidate   Intra-procedure monitoring:  Blood pressure monitoring, continuous capnometry, frequent LOC assessments, frequent vital sign checks, cardiac monitor and continuous pulse oximetry   Intra-procedure events: none     Total Provider sedation time (minutes):  21 Post-procedure details:    Post-sedation assessment completed:  07/07/2017 2:18 AM   Attendance: Constant attendance by certified staff until patient recovered     Recovery: Patient returned to pre-procedure baseline     Post-sedation assessments completed and reviewed: airway patency, cardiovascular function, hydration  status, mental status, nausea/vomiting, pain level and respiratory function     Patient is stable for discharge or admission: yes     Patient tolerance:  Tolerated well, no immediate complications     CRITICAL CARE Performed by: Geral Coker   Total critical care time: 41 minutes for synchronized cardioversion in afib with RVR patient with HR > 140  Critical care time was exclusive of separately billable procedures and treating other patients.  Critical care was necessary to treat or prevent imminent or life-threatening deterioration.  Critical care was time spent personally by me on the following activities: development of treatment plan with patient and/or surrogate as well as nursing, discussions with consultants, evaluation of patient's response to treatment, examination of patient, obtaining history from patient or surrogate, ordering and performing treatments and interventions, ordering and review of laboratory studies, ordering and review of radiographic studies, pulse oximetry and re-evaluation of patient's condition.     (including critical care time)    Medications Ordered in ED Medications  etomidate (AMIDATE) 2 MG/ML injection (not administered)  rivaroxaban (XARELTO) tablet 20 mg (not administered)  etomidate (AMIDATE) injection 8 mg (8 mg Intravenous Given 07/07/17 0118)  rivaroxaban (XARELTO) Education Kit for Afib patients ( Does not apply Given 07/07/17 0204)     Initial Impression / Assessment and Plan / ED Course  I have reviewed the triage vital signs and the nursing notes.  Pertinent labs & imaging results that were available during my care of the patient were reviewed by me and considered in my medical decision making (see chart for details).     Pt comes in with cc of palpitations. Pt is noted to be in Afib with RVR. He denies any recent symptoms of palpitations, chest pain or dib. Symptoms were sudden onset, and unprovoked. No risk factors for PE or  concerns for underlying infection.  We will cardiovert.  LATE ENTRY: Patient back to baseline prior to being discharged.      CHA2DS2/VAS Stroke Risk Points      0 >= 2 Points: High Risk  1 - 1.99 Points: Medium Risk  0 Points: Low Risk    The patient's score has not changed in the past year.:  No Change     Details    This score determines the patient's risk of having a stroke if the  patient has atrial fibrillation.       Points Metrics  0 Has Congestive Heart Failure:  No   0 Has Vascular Disease:  No   1 Has Hypertension:  YES 0 Age:  34   0 Has Diabetes:  No   0 Had Stroke:  No  Had TIA:  No  Had thromboembolism:  No   0 Male:  No      Final Clinical Impressions(s) / ED Diagnoses   Final diagnoses:  Atrial fibrillation with rapid ventricular response [facility])    ED Discharge Orders        Ordered    Amb referral to AFIB Clinic     07/07/17 0037       Varney Biles, MD 07/07/17 [place], [place], MD 07/20/17 [place], Raymonde Hamblin, MD 07/20/17 1541

## 2017-07-07 NOTE — Discharge Instructions (Signed)
Please start taking the Xarelto as prescribed.  Continue to take your cardiac medications.  Follow-up with the A. fib clinic within the next 1 week.

## 2017-07-07 NOTE — ED Notes (Signed)
Pt understood dc material. NAD noted. Scripts given at dc 

## 2017-07-07 NOTE — ED Notes (Addendum)
Shock delivered with 150j by Catalina Antigua, RN.  Pt sat up and layed back down. VSS. Converted back to NSR at this time. HR 75.  EKG printed and given to md. Pt awake, but sleepy.

## 2017-07-07 NOTE — ED Notes (Signed)
Patient denies pain and is resting comfortably.  

## 2017-07-09 ENCOUNTER — Ambulatory Visit (HOSPITAL_COMMUNITY)
Admission: RE | Admit: 2017-07-09 | Discharge: 2017-07-09 | Disposition: A | Discharge status: Home / Self Care | Payer: 59 | Source: Ambulatory Visit | Attending: Nurse Practitioner | Admitting: Nurse Practitioner

## 2017-07-09 ENCOUNTER — Encounter (HOSPITAL_COMMUNITY): Payer: Self-pay | Admitting: Nurse Practitioner

## 2017-07-09 VITALS — BP 162/100 | HR 90 | Ht 75.5 in | Wt 330.0 lb

## 2017-07-09 DIAGNOSIS — Z7901 Long term (current) use of anticoagulants: Secondary | ICD-10-CM | POA: Diagnosis not present

## 2017-07-09 DIAGNOSIS — I10 Essential (primary) hypertension: Secondary | ICD-10-CM | POA: Insufficient documentation

## 2017-07-09 DIAGNOSIS — Z79899 Other long term (current) drug therapy: Secondary | ICD-10-CM | POA: Diagnosis not present

## 2017-07-09 DIAGNOSIS — I480 Paroxysmal atrial fibrillation: Secondary | ICD-10-CM

## 2017-07-09 DIAGNOSIS — I4891 Unspecified atrial fibrillation: Secondary | ICD-10-CM | POA: Diagnosis not present

## 2017-07-09 DIAGNOSIS — R634 Abnormal weight loss: Secondary | ICD-10-CM | POA: Diagnosis not present

## 2017-07-09 MED ORDER — METOPROLOL SUCCINATE ER 25 MG PO TB24: 37.5000 mg | ORAL_TABLET | Freq: Two times a day (BID) | ORAL | 3 refills | Status: DC

## 2017-07-09 NOTE — Patient Instructions (Signed)
Increase metoprolol to 1 and 1/2 tablet twice a day

## 2017-07-09 NOTE — Progress Notes (Addendum)
Patient ID: Donald Reyes, male   DOB: 07/26/1975, 42 y.o.   MRN: [id number]     Primary Care Physician: Antony Contras  Referring Physician: [facility] ER f/u   Donald Reyes is a 42 y.o. male with a h/o PAF newly diagnosed on ER visit for elevated heart rate 09/19/16. He states that he was working out in the yard earlier in the day and felt his heart rate speed up and felt fullness in his chest. 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 When it did not slow down, he went to the ER and was found to be in Afib with RVR. Since he had been in afib for just a few hours, it was decided to cardiovert pt which was successful. He had f/u in the afib clinic where lifestyle issues were stressed to lose weight. regular exercise, management of HTN. Echo showed LVH. He took DOAC x one ont and then stopped due to chadsvasc score of 1.  Pt presented to ER 2/3 at onset of afib in the setting of increased caffeine intake x two days and eating salty foods while watching the Super bowl. He presented to the ER and had successful cardioversion. He was placed back on xarelto 20 mg q day. His BP is elevated in the office today, on recheck 154/104. He has gained weight and has stopped exercising since the summer. 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  Today, he denies symptoms of palpitations, chest pain, shortness of breath, orthopnea, PND, lower extremity edema, dizziness, presyncope, syncope, or neurologic sequela. The patient is tolerating medications without difficulties and is otherwise without complaint today.   No past medical history on file. No past surgical history on file.  Current Outpatient Medications  Medication Sig Dispense Refill  . amLODipine (NORVASC) 10 MG tablet Take 10 mg by mouth daily.    . calcium carbonate (TUMS - DOSED IN MG ELEMENTAL CALCIUM) 500 MG chewable tablet Chew 1-2 tablets by mouth 3 (three) times daily as needed for indigestion or  heartburn.    . metoprolol succinate (TOPROL XL) 25 MG 24 hr tablet Take 1.5 tablets (37.5 mg total) by mouth 2 (two) times daily. 90 tablet 3  . rivaroxaban (XARELTO) 20 MG TABS tablet Take 1 tablet (20 mg total) by mouth daily with supper. 30 tablet 0   No current facility-administered medications for this encounter.     No Known Allergies  Social History   Socioeconomic History  . Marital status: Married    Spouse name: Not on file  . Number of children: Not on file  . Years of education: Not on file  . Highest education level: Not on file  Social Needs  . Financial resource strain: Not on file  . Food insecurity - worry: Not on file  . Food insecurity - inability: Not on file  . Transportation needs - medical: Not on file  . Transportation needs - non-medical: Not on file  Occupational History  . Not on file  Tobacco Use  . Smoking status: Never Smoker  . Smokeless tobacco: Never Used  Substance and Sexual Activity  . Alcohol use: No  . Drug use: Not on file  . Sexual activity: Not on file  Other Topics Concern  . Not on file  Social History Narrative  . Not on file    No family history on file.  ROS- All systems are reviewed and negative except as per the HPI above  Physical Exam: Vitals:   07/09/17 0940  BP: (!) 162/100  Pulse: 90  Weight: (!) 330 lb (149.7 kg)  Height: 6' 3.5" (1.918 m)    GEN- The patient is well appearing, alert and oriented x 3 today.   Head- normocephalic, atraumatic Eyes-  Sclera clear, conjunctiva pink Ears- hearing intact Oropharynx- clear Neck- supple, no JVP Lymph- no cervical lymphadenopathy Lungs- Clear to ausculation bilaterally, normal work of breathing Heart- Regular, rapid rate and rhythm, no murmurs, rubs or gallops, PMI not laterally displaced GI- soft, NT, ND, + BS Extremities- no clubbing, cyanosis, or edema MS- no significant deformity or atrophy Skin- no rash or lesion Psych- euthymic mood, full  affect Neuro- strength and sensation are intact  EKG-NSR at 90 bpm, PR int 188 ms, qrs int 80 ms, qtc 425 ms Epic records reviewed Echo- - Left ventricle: The cavity size was normal. Wall thickness was  increased in a pattern of moderate LVH. Systolic function was  normal. The estimated ejection fraction was in the range of 60%  to 65%. Wall motion was normal; there were no regional wall  motion abnormalities. Doppler parameters are consistent with  abnormal left ventricular relaxation (grade 1 diastolic  dysfunction). The E/e&' ratio is between 8-15, suggesting  indeterminate LV filling pressure. - Left atrium: The atrium was normal in size. - Right atrium: The atrium was normal in size. - Inferior vena cava: The vessel was normal in size. The  respirophasic diameter changes were in the normal range (>= 50%),  consistent with normal central venous pressure.  Impressions:  - LVEF 60-65%, moderate LVH, normal wall motion, diastolic  dysfunction, indeterminate LV filling pressure, normal biatrial  size, normal IVC.  Assessment and Plan: 1. New onset afib Successful cardioversion  Triggers again reviewed Minimize caffeine Weight loss Regular exercise Better BP control,  Continue xarelto x at least 30 days after cardioversion for chads vasc of 1 HTN  BB Wife states that he is not currently snoring  2. Elevated BP Increase  metoprolol ER  to  25 mg 1 1/2 tab bid F/u with PCP as scheduled in 10 days as already scheduled for further management  of BP Avoid salty foods   F/u in one month here, as scheduled with PCP 2/15  Addendum- 2/7- Pt called back to office and reported that he is on metoprolol 50 mg bid ,, not 25 mg bid as per Epic. Increase Metoprolol to 75 mg bid.  Geroge Reyes Donald Reyes, [facility] [address] [place], [place] [address] [phone]

## 2017-07-10 ENCOUNTER — Other Ambulatory Visit (HOSPITAL_COMMUNITY): Payer: Self-pay | Admitting: *Deleted

## 2017-07-10 MED ORDER — METOPROLOL SUCCINATE ER 50 MG PO TB24: ORAL_TABLET | ORAL | Status: DC

## 2017-07-10 NOTE — Addendum Note (Signed)
Encounter addended by: Sherran Needs, NP on: 07/10/2017 2:53 PM  Actions taken: Sign clinical note

## 2017-07-18 DIAGNOSIS — E7800 Pure hypercholesterolemia, unspecified: Secondary | ICD-10-CM | POA: Diagnosis not present

## 2017-07-18 DIAGNOSIS — I480 Paroxysmal atrial fibrillation: Secondary | ICD-10-CM | POA: Diagnosis not present

## 2017-07-18 DIAGNOSIS — R7303 Prediabetes: Secondary | ICD-10-CM | POA: Diagnosis not present

## 2017-07-18 DIAGNOSIS — I10 Essential (primary) hypertension: Secondary | ICD-10-CM | POA: Diagnosis not present

## 2017-08-05 ENCOUNTER — Encounter (HOSPITAL_COMMUNITY): Payer: Self-pay | Admitting: Nurse Practitioner

## 2017-08-05 ENCOUNTER — Ambulatory Visit (HOSPITAL_COMMUNITY)
Admission: RE | Admit: 2017-08-05 | Discharge: 2017-08-05 | Disposition: A | Discharge status: Home / Self Care | Payer: 59 | Source: Ambulatory Visit | Attending: Nurse Practitioner | Admitting: Nurse Practitioner

## 2017-08-05 VITALS — BP 162/94 | HR 61 | Ht 75.5 in | Wt 335.0 lb

## 2017-08-05 DIAGNOSIS — Z79899 Other long term (current) drug therapy: Secondary | ICD-10-CM | POA: Diagnosis not present

## 2017-08-05 DIAGNOSIS — I480 Paroxysmal atrial fibrillation: Secondary | ICD-10-CM | POA: Diagnosis not present

## 2017-08-05 DIAGNOSIS — Z7901 Long term (current) use of anticoagulants: Secondary | ICD-10-CM | POA: Diagnosis not present

## 2017-08-05 DIAGNOSIS — I4891 Unspecified atrial fibrillation: Secondary | ICD-10-CM | POA: Diagnosis not present

## 2017-08-05 DIAGNOSIS — I10 Essential (primary) hypertension: Secondary | ICD-10-CM | POA: Diagnosis not present

## 2017-08-05 NOTE — Progress Notes (Signed)
Patient ID: Donald Reyes, male   DOB: [birth year], 42 y.o.   MRN: [id number]     Primary Care Physician: Antony Contras  Referring Physician: [facility] ER f/u   Donald Reyes is a 42 y.o. male with a h/o PAF newly diagnosed on ER visit for elevated heart rate 09/19/16. He states that he was working out in the yard earlier in the day and felt his heart rate speed up and felt fullness in his chest. 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 When it did not slow down, he went to the ER and was found to be in Afib with RVR. Since he had been in afib for just a few hours, it was decided to cardiovert pt which was successful. He had f/u in the afib clinic where lifestyle issues were stressed to lose weight. regular exercise, management of HTN. Echo showed LVH. He took DOAC x one ont and then stopped due to chadsvasc score of 1.  Pt presented to ER 2/3 at onset of afib in the setting of increased caffeine intake x two days and eating salty foods while watching the Super bowl. He presented to the ER and had successful cardioversion. He was placed back on xarelto 20 mg q day. His BP is elevated in the office today, on recheck 154/104. He has gained weight and has stopped exercising since the summer. 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  F/u in afib clinic, 3/5, he is staying in [place]. He has one xarelto to take and then will be done with the 4 week s/p cardioversion protocol. He will not have to continue for chadsvasc score of 1. He states no further afib. He has cut out caffeine, is exercising and reduced carbs but has not really lost any weight yet. His PCP is addressing BP issues, on last visit BB was increased to 100 mg bid.  Today, he denies symptoms of palpitations, chest pain, shortness of breath, orthopnea, PND, lower extremity edema, dizziness, presyncope, syncope, or neurologic sequela. The patient is tolerating medications without difficulties and is  otherwise without complaint today.   No past medical history on file. No past surgical history on file.  Current Outpatient Medications  Medication Sig Dispense Refill  . amLODipine (NORVASC) 10 MG tablet Take 10 mg by mouth daily.    . calcium carbonate (TUMS - DOSED IN MG ELEMENTAL CALCIUM) 500 MG chewable tablet Chew 1-2 tablets by mouth 3 (three) times daily as needed for indigestion or heartburn.    . metoprolol succinate (TOPROL-XL) 100 MG 24 hr tablet Take 100 mg by mouth 2 (two) times daily. Take with or immediately following a meal.    . rivaroxaban (XARELTO) 20 MG TABS tablet Take 1 tablet (20 mg total) by mouth daily with supper. 30 tablet 0   No current facility-administered medications for this encounter.     No Known Allergies  Social History   Socioeconomic History  . Marital status: Married    Spouse name: Not on file  . Number of children: Not on file  . Years of education: Not on file  . Highest education level: Not on file  Social Needs  . Financial resource strain: Not on file  . Food insecurity - worry: Not on file  . Food insecurity - inability: Not on file  . Transportation needs - medical: Not on file  . Transportation needs - non-medical: Not on file  Occupational History  . Not  on file  Tobacco Use  . Smoking status: Never Smoker  . Smokeless tobacco: Never Used  Substance and Sexual Activity  . Alcohol use: No  . Drug use: Not on file  . Sexual activity: Not on file  Other Topics Concern  . Not on file  Social History Narrative  . Not on file    No family history on file.  ROS- All systems are reviewed and negative except as per the HPI above  Physical Exam: Vitals:   08/05/17 0934  BP: (!) 162/94  Pulse: 61  Weight: (!) 335 lb (152 kg)  Height: 6' 3.5" (1.918 m)    GEN- The patient is well appearing, alert and oriented x 3 today.   Head- normocephalic, atraumatic Eyes-  Sclera clear, conjunctiva pink Ears- hearing  intact Oropharynx- clear Neck- supple, no JVP Lymph- no cervical lymphadenopathy Lungs- Clear to ausculation bilaterally, normal work of breathing Heart- Regular, rapid rate and rhythm, no murmurs, rubs or gallops, PMI not laterally displaced GI- soft, NT, ND, + BS Extremities- no clubbing, cyanosis, or edema MS- no significant deformity or atrophy Skin- no rash or lesion Psych- euthymic mood, full affect Neuro- strength and sensation are intact  EKG-NSR at 61 bpm,  PR int 184 ms, qrs int 84 ms, qtc 382  ms Epic records reviewed Echo- - Left ventricle: The cavity size was normal. Wall thickness was  increased in a pattern of moderate LVH. Systolic function was  normal. The estimated ejection fraction was in the range of 60%  to 65%. Wall motion was normal; there were no regional wall  motion abnormalities. Doppler parameters are consistent with  abnormal left ventricular relaxation (grade 1 diastolic  dysfunction). The E/e&' ratio is between 8-15, suggesting  indeterminate LV filling pressure. - Left atrium: The atrium was normal in size. - Right atrium: The atrium was normal in size. - Inferior vena cava: The vessel was normal in size. The  respirophasic diameter changes were in the normal range (>= 50%),  consistent with normal central venous pressure.  Impressions:  - LVEF 60-65%, moderate LVH, normal wall motion, diastolic  dysfunction, indeterminate LV filling pressure, normal biatrial  size, normal IVC.  Assessment and Plan: 1. New onset afib Successful cardioversion  Triggers again reviewed Minimize caffeine Weight loss Regular exercise Better BP control,  Can stop xarelto after today, will have finished s/p cardioversion 4 week anticoagulation,  for chads vasc of 1, HTN  Continue BB  2.HTN Continue current meds F/u with PCP as scheduled for BP management  Avoid salty foods Exercise/Weight loss   afib clinic as needed  Butch Penny C. Shenelle Klas,  [facility] [address] [place], [place] [address] [phone]

## 2017-09-16 DIAGNOSIS — Z01812 Encounter for preprocedural laboratory examination: Secondary | ICD-10-CM | POA: Diagnosis not present

## 2017-09-24 DIAGNOSIS — D251 Intramural leiomyoma of uterus: Secondary | ICD-10-CM | POA: Diagnosis not present

## 2017-09-24 DIAGNOSIS — N736 Female pelvic peritoneal adhesions (postinfective): Secondary | ICD-10-CM | POA: Diagnosis not present

## 2017-09-24 DIAGNOSIS — N979 Female infertility, unspecified: Secondary | ICD-10-CM | POA: Diagnosis not present

## 2017-10-16 DIAGNOSIS — I10 Essential (primary) hypertension: Secondary | ICD-10-CM | POA: Diagnosis not present

## 2017-10-16 DIAGNOSIS — E7800 Pure hypercholesterolemia, unspecified: Secondary | ICD-10-CM | POA: Diagnosis not present

## 2017-10-16 DIAGNOSIS — I480 Paroxysmal atrial fibrillation: Secondary | ICD-10-CM | POA: Diagnosis not present

## 2018-02-10 DIAGNOSIS — Z23 Encounter for immunization: Secondary | ICD-10-CM | POA: Diagnosis not present

## 2018-02-10 DIAGNOSIS — E7800 Pure hypercholesterolemia, unspecified: Secondary | ICD-10-CM | POA: Diagnosis not present

## 2018-02-10 DIAGNOSIS — R7303 Prediabetes: Secondary | ICD-10-CM | POA: Diagnosis not present

## 2018-02-10 DIAGNOSIS — I480 Paroxysmal atrial fibrillation: Secondary | ICD-10-CM | POA: Diagnosis not present

## 2018-02-10 DIAGNOSIS — I10 Essential (primary) hypertension: Secondary | ICD-10-CM | POA: Diagnosis not present

## 2018-02-13 DIAGNOSIS — E875 Hyperkalemia: Secondary | ICD-10-CM | POA: Diagnosis not present

## 2018-08-11 DIAGNOSIS — Z0000 Encounter for general adult medical examination without abnormal findings: Secondary | ICD-10-CM | POA: Diagnosis not present

## 2018-08-11 DIAGNOSIS — R7303 Prediabetes: Secondary | ICD-10-CM | POA: Diagnosis not present

## 2018-08-11 DIAGNOSIS — I480 Paroxysmal atrial fibrillation: Secondary | ICD-10-CM | POA: Diagnosis not present

## 2018-08-11 DIAGNOSIS — Z23 Encounter for immunization: Secondary | ICD-10-CM | POA: Diagnosis not present

## 2018-08-11 DIAGNOSIS — E7800 Pure hypercholesterolemia, unspecified: Secondary | ICD-10-CM | POA: Diagnosis not present

## 2018-08-11 DIAGNOSIS — I10 Essential (primary) hypertension: Secondary | ICD-10-CM | POA: Diagnosis not present
